# Patient Record
Sex: Female | Born: 1995 | Race: Black or African American | Hispanic: No | Marital: Single | Smoking: Current every day smoker
Health system: Southern US, Community
[De-identification: ages and names within clinical notes are randomized; demographics above are authoritative.]

## PROBLEM LIST (undated history)

## (undated) DIAGNOSIS — J45909 Unspecified asthma, uncomplicated: Secondary | ICD-10-CM

## (undated) DIAGNOSIS — L309 Dermatitis, unspecified: Secondary | ICD-10-CM

## (undated) HISTORY — PX: OTHER SURGICAL HISTORY: SHX169

---

## 2014-05-19 ENCOUNTER — Emergency Department (INDEPENDENT_AMBULATORY_CARE_PROVIDER_SITE_OTHER)
Admission: EM | Admit: 2014-05-19 | Discharge: 2014-05-19 | Disposition: A | Discharge status: Home / Self Care | Payer: Medicaid Other | Source: Home / Self Care

## 2014-05-19 ENCOUNTER — Encounter (HOSPITAL_COMMUNITY): Payer: Self-pay | Admitting: Emergency Medicine

## 2014-05-19 DIAGNOSIS — J4521 Mild intermittent asthma with (acute) exacerbation: Secondary | ICD-10-CM

## 2014-05-19 DIAGNOSIS — J45901 Unspecified asthma with (acute) exacerbation: Secondary | ICD-10-CM

## 2014-05-19 HISTORY — DX: Unspecified asthma, uncomplicated: J45.909

## 2014-05-19 MED ORDER — PREDNISONE 20 MG PO TABS: ORAL_TABLET | ORAL | Status: DC

## 2014-05-19 NOTE — ED Notes (Signed)
Very  Anxious  On  Arrival  Better  After  reassurance

## 2014-05-19 NOTE — ED Provider Notes (Signed)
CSN: [id number]     Arrival date & time 05/19/14  [id number] History   First MD Initiated Contact with Patient 05/19/14 0935     Chief Complaint  Patient presents with  . Asthma   (Consider location/radiation/quality/duration/timing/severity/associated sxs/prior Treatment) HPI Comments: While exercising this AM experienced asthma attack. She used Albuterol HFA prior to exercise and at onset of wheeze. Presented in a panic mode but calmed down a few minutes later. St feel generally well now and breathing normally. Deniies allergy sx's.   Past Medical History  Diagnosis Date  . Asthma    History reviewed. No pertinent past surgical history. History reviewed. No pertinent family history. History  Substance Use Topics  . Smoking status: Never Smoker   . Smokeless tobacco: Not on file  . Alcohol Use: No   OB History   Grav Para Term Preterm Abortions TAB SAB Ect Mult Living                 Review of Systems  Constitutional: Positive for activity change. Negative for fever and fatigue.  HENT: Negative.   Respiratory: Positive for shortness of breath and wheezing. Negative for cough.   Cardiovascular: Negative for chest pain and leg swelling.  Gastrointestinal: Negative.   Skin: Negative.     Allergies  Review of patient's allergies indicates no known allergies.  Home Medications   Prior to Admission medications   Medication Sig Start Date End Date Taking? Authorizing Provider  ALBUTEROL IN Inhale into the lungs.   Yes Historical Provider, MD  predniSONE (DELTASONE) 20 MG tablet Take 3 tabs po on first day, 2 tabs second day, 2 tabs third day, 1 tab fourth day, 1 tab 5th day. Take with food. 05/19/14   Hayden Rasmussenavid Courvoisier Hamblen, NP   BP 122/78  Pulse 84  Temp(Src) 98.6 F (37 C) (Oral)  Resp 18  SpO2 100%  LMP 05/16/2014 Physical Exam  Nursing note and vitals reviewed. Constitutional: She is oriented to person, place, and time. She appears well-developed. No distress.  HENT:  Mouth/Throat:  Oropharynx is clear and moist.  Eyes: Conjunctivae and EOM are normal.  Neck: Normal range of motion. Neck supple.  Cardiovascular: Normal rate, regular rhythm and normal heart sounds.   Pulmonary/Chest: Effort normal and breath sounds normal. No respiratory distress. She has no wheezes. She has no rales.  No wheezing. Exp phase minimally prolonged.   Musculoskeletal: Normal range of motion. She exhibits no edema.  Neurological: She is alert and oriented to person, place, and time. She exhibits normal muscle tone.  Skin: Skin is warm and dry.  Psychiatric: She has a normal mood and affect.    ED Course  Procedures (including critical care time) Labs Review Labs Reviewed - No data to display  Imaging Review No results found.   MDM   1. Asthma exacerbation attacks, mild intermittent     Cont albuterol as directed Add 5 d course Prednisone See PCP, may need daily corticosteroid inhaler.       Hayden Rasmussenavid Conard Alvira, NP 05/19/14 [phone]

## 2014-05-19 NOTE — ED Notes (Signed)
Pt has  Asthma       -  She  Ras  Running  This  Am  When  She  Developed   Some  Shortness  Of  Breath        And    Tightness  In  Chest       She  Used  Her  Inhaler

## 2014-05-19 NOTE — Discharge Instructions (Signed)
Asthma, Acute Bronchospasm °Acute bronchospasm caused by asthma is also referred to as an asthma attack. Bronchospasm means your air passages become narrowed. The narrowing is caused by inflammation and tightening of the muscles in the air tubes (bronchi) in your lungs. This can make it hard to breathe or cause you to wheeze and cough. °CAUSES °Possible triggers are: °· Animal dander from the skin, hair, or feathers of animals. °· Dust mites contained in house dust. °· Cockroaches. °· Pollen from trees or grass. °· Mold. °· Cigarette or tobacco smoke. °· Air pollutants such as dust, household cleaners, hair sprays, aerosol sprays, paint fumes, strong chemicals, or strong odors. °· Cold air or weather changes. Cold air may trigger inflammation. Winds increase molds and pollens in the air. °· Strong emotions such as crying or laughing hard. °· Stress. °· Certain medicines such as aspirin or beta-blockers. °· Sulfites in foods and drinks, such as dried fruits and wine. °· Infections or inflammatory conditions, such as a flu, cold, or inflammation of the nasal membranes (rhinitis). °· Gastroesophageal reflux disease (GERD). GERD is a condition where stomach acid backs up into your esophagus. °· Exercise or strenuous activity. °SIGNS AND SYMPTOMS  °· Wheezing. °· Excessive coughing, particularly at night. °· Chest tightness. °· Shortness of breath. °DIAGNOSIS  °Your health care provider will ask you about your medical history and perform a physical exam. A chest X-ray or blood testing may be performed to look for other causes of your symptoms or other conditions that may have triggered your asthma attack.  °TREATMENT  °Treatment is aimed at reducing inflammation and opening up the airways in your lungs.  Most asthma attacks are treated with inhaled medicines. These include quick relief or rescue medicines (such as bronchodilators) and controller medicines (such as inhaled corticosteroids). These medicines are sometimes  given through an inhaler or a nebulizer. Systemic steroid medicine taken by mouth or given through an IV tube also can be used to reduce the inflammation when an attack is moderate or severe. Antibiotic medicines are only used if a bacterial infection is present.  °HOME CARE INSTRUCTIONS  °· Rest. °· Drink plenty of liquids. This helps the mucus to remain thin and be easily coughed up. Only use caffeine in moderation and do not use alcohol until you have recovered from your illness. °· Do not smoke. Avoid being exposed to secondhand smoke. °· You play a critical role in keeping yourself in good health. Avoid exposure to things that cause you to wheeze or to have breathing problems. °· Keep your medicines up-to-date and available. Carefully follow your health care provider's treatment plan. °· Take your medicine exactly as prescribed. °· When pollen or pollution is bad, keep windows closed and use an air conditioner or go to places with air conditioning. °· Asthma requires careful medical care. See your health care provider for a follow-up as advised. If you are more than [redacted] weeks pregnant and you were prescribed any new medicines, let your obstetrician know about the visit and how you are doing. Follow up with your health care provider as directed. °· After you have recovered from your asthma attack, make an appointment with your outpatient doctor to talk about ways to reduce the likelihood of future attacks. If you do not have a doctor who manages your asthma, make an appointment with a primary care doctor to discuss your asthma. °SEEK IMMEDIATE MEDICAL CARE IF:  °· You are getting worse. °· You have trouble breathing. If severe, call your local   emergency services (911 in the [place]  You develop chest pain or discomfort.  You are vomiting.  You are not able to keep fluids down.  You are coughing up yellow, green, brown, or bloody sputum.  You have a fever and your symptoms suddenly get worse.  You have  trouble swallowing. MAKE SURE YOU:   Understand these instructions.  Will watch your condition.  Will get help right away if you are not doing well or get worse. Document Released: 01/18/2007 Document Revised: 10/08/2013 Document Reviewed: 04/10/2013 [facility]Exit[facility] Patient Information 2015 [employer], [place]. This information is not intended to replace advice given to you by your health care provider. Make sure you discuss any questions you have with your health care provider.  Bronchospasm A bronchospasm is when the tubes that carry air in and out of your lungs (airways) spasm or tighten. During a bronchospasm it is hard to breathe. This is because the airways get smaller. A bronchospasm can be triggered by:  Allergies. These may be to animals, pollen, food, or mold.  Infection. This is a common cause of bronchospasm.  Exercise.  Irritants. These include pollution, cigarette smoke, strong odors, aerosol sprays, and paint fumes.  Weather changes.  Stress.  Being emotional. HOME CARE   Always have a plan for getting help. Know when to call your doctor and local emergency services (911 in the [place] Know where you can get emergency care.  Only take medicines as told by your doctor.  If you were prescribed an inhaler or nebulizer machine, ask your doctor how to use it correctly. Always use a spacer with your inhaler if you were given one.  Stay calm during an attack. Try to relax and breathe more slowly.  Control your home environment:  Change your heating and air conditioning filter at least once a month.  Limit your use of fireplaces and wood stoves.  Do not  smoke. Do not  allow smoking in your home.  Avoid perfumes and fragrances.  Get rid of pests (such as roaches and mice) and their droppings.  Throw away plants if you see mold on them.  Keep your house clean and dust free.  Replace carpet with wood, tile, or vinyl flooring. Carpet can trap dander and dust.  Use  allergy-proof pillows, mattress covers, and box spring covers.  Wash bed sheets and blankets every week in hot water. Dry them in a dryer.  Use blankets that are made of polyester or cotton.  Wash hands frequently. GET HELP IF:  You have muscle aches.  You have chest pain.  The thick spit you spit or cough up (sputum) changes from clear or white to yellow, green, gray, or bloody.  The thick spit you spit or cough up gets thicker.  There are problems that may be related to the medicine you are given such as:  A rash.  Itching.  Swelling.  Trouble breathing. GET HELP RIGHT AWAY IF:  You feel you cannot breathe or catch your breath.  You cannot stop coughing.  Your treatment is not helping you breathe better.  You have very bad chest pain. MAKE SURE YOU:   Understand these instructions.  Will watch your condition.  Will get help right away if you are not doing well or get worse. Document Released: 07/31/2009 Document Revised: 10/08/2013 Document Reviewed: 03/26/2013 [facility]Exit[facility] Patient Information 2015 [employer], [place]. This information is not intended to replace advice given to you by your health care provider. Make sure you discuss any questions  you have with your health care provider.

## 2014-05-21 NOTE — ED Provider Notes (Signed)
Medical screening examination/treatment/procedure(s) were performed by non-physician practitioner and as supervising physician I was immediately available for consultation/collaboration.  Leslee Homeavid Mehtaab Mayeda, M.D.   Reuben Likesavid C Ryver Zadrozny, MD 05/21/14 2200

## 2014-05-30 ENCOUNTER — Emergency Department (HOSPITAL_COMMUNITY)
Admission: EM | Admit: 2014-05-30 | Discharge: 2014-05-30 | Disposition: A | Discharge status: Home / Self Care | Payer: Medicaid Other | Attending: Emergency Medicine | Admitting: Emergency Medicine

## 2014-05-30 ENCOUNTER — Encounter (HOSPITAL_COMMUNITY): Payer: Self-pay | Admitting: Emergency Medicine

## 2014-05-30 DIAGNOSIS — Z79899 Other long term (current) drug therapy: Secondary | ICD-10-CM | POA: Diagnosis not present

## 2014-05-30 DIAGNOSIS — IMO0002 Reserved for concepts with insufficient information to code with codable children: Secondary | ICD-10-CM | POA: Insufficient documentation

## 2014-05-30 DIAGNOSIS — J45901 Unspecified asthma with (acute) exacerbation: Secondary | ICD-10-CM | POA: Insufficient documentation

## 2014-05-30 DIAGNOSIS — J45909 Unspecified asthma, uncomplicated: Secondary | ICD-10-CM | POA: Diagnosis present

## 2014-05-30 DIAGNOSIS — J4521 Mild intermittent asthma with (acute) exacerbation: Secondary | ICD-10-CM

## 2014-05-30 DIAGNOSIS — F411 Generalized anxiety disorder: Secondary | ICD-10-CM | POA: Diagnosis not present

## 2014-05-30 NOTE — ED Notes (Signed)
Per EMS - pt was at band practice when she suddenly felt sob and started having an asthma attack. Pt tried using inhaler but it didn't help. Upon ems arrival pt was only responsive to painful stimuli and very tachypneic. Pt has become more alert en route but has remained non-verbal. Pt eyes open and looking around the room, nodding head yes to questions. ems heard small wheeze and administered 5 mg of albuterol nebulizer treatment, after treatment the wheeze was gone and lung sounds clear. Pt also started her menstrual period this morning and had been c/o cramps prior to this incident. BP 124/92, HR initially 114, now 92, CBG 102.

## 2014-05-30 NOTE — ED Notes (Signed)
Pt now talking. Denies sob/cp/pain to any location. Pt sts she was rushing to go get her instrument for practice then started to feel sob so she laid down on the floor, sts she tried using her inhaler but didn't help at all. Nad, skin warm and dry, resp e/u.

## 2014-05-30 NOTE — ED Provider Notes (Signed)
CSN: [id number]     Arrival date & time 05/30/14  [id number] History   First MD Initiated Contact with Patient 05/30/14 [phone]     Chief Complaint  Patient presents with  . Shortness of Breath   HPI Stacy Munoz is an 18 yo woman with a PMH of asthma (managed on daily albuterol, never hospitalized or intubated). 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 She had taken one puff of her albuterol inhaler prior to practice this morning; when she lay down, her teammate brought her the inhaler and she took 2 more puffs. EMS reports that she was quiet, but responsive to pain upon their arrival; she had some rare wheezing on their exam. EMS delivered 5 mg albuterol on the way to the hospital and conducted an unremarkable EKG. She started her menstrual period this morning and is experiencing some cramping. She denies recent URI, cough, subjective fevers, chills or chest pain. She is starting college this year, but denies any recent stressors or any history of anxiety.  Of note, she was evaluated in the ED 11 days ago with a similar presentation (shortness of breath and tightness in chest after exercise) and was given albuterol and a 5 day course of prednisone, which she completed.   Past Medical History  Diagnosis Date  . Asthma    No past surgical history on file. No family history on file. History  Substance Use Topics  . Smoking status: Never Smoker   . Smokeless tobacco: Not on file  . Alcohol Use: No   OB History   Grav Para Term Preterm Abortions TAB SAB Ect Mult Living                 Review of Systems General: just started college and college band, no recent illness Skin: no rashes HEENT: no headaches, no changes in vision or hearing Cardiac: no chest pain, no palpitations Respiratory: see HPI, shortness of breath, has been taking albuterol puff prior to each band practice (in that she  knows she will be running) GI: no changes in BMs, no abdominal pain Urinary: no changes in urination Msk: no joint pain or swelling Endocrine: no temperature intolerance, no weight changes Psychiatric: patient states that she has no recent stressors and does not think she has ever experienced anxiety   Allergies  Review of patient's allergies indicates no known allergies.  Home Medications   Prior to Admission medications   Medication Sig Start Date End Date Taking? Authorizing Provider  ALBUTEROL IN Inhale into the lungs.    Historical Provider, MD  predniSONE (DELTASONE) 20 MG tablet Take 3 tabs po on first day, 2 tabs second day, 2 tabs third day, 1 tab fourth day, 1 tab 5th day. Take with food. 05/19/14   Hayden Rasmussenavid Mabe, NP   LMP 05/16/2014 Physical Exam Vitals: BP 122/78, P 84, RR 18 SpO2 100% T 98.6 Appearance: anxious-appearing HEENT: AT/[place], PERRL, EOMi Heart: RRR, normal S1S2 Lungs: shallow breathing, no wheezes, CTAB Abdomen: BS+, nontender, no heaptosplenomegaly Musculoskeletal: nontender, no joint swelling Extremities: no edema Neurologic: A&O, quiet in her speech, appears anxious Skin: no rashes  ED Course  Procedures (including critical care time) Labs Review Labs Reviewed - No data to display  Imaging Review No results found.   EKG Interpretation None      MDM   Final diagnoses:  None    Stacy Munoz  is an 18 yo woman with a PMH of mild asthma who is here with a very mild asthma exacerbation after running. In the ED, she has been satting 100% on room air and is not in any sort of respiratory distress; this is s/p several albuterol treatments (4 in total today). There are no signs of recent or current infection, as the patient is afebrile and has no history of cough or subjective fever. She is therefore not in need of another steroid treatment. She does not need a refill of her albuterol. She will be sent out with reassurance.    Dionne Ano,  MD 05/30/14 1042  Dionne Ano, MD 05/30/14 1051

## 2014-05-30 NOTE — Discharge Instructions (Signed)
You likely experienced an asthma exacerbation at practice today; once you were in the hospital, your breathing was perfect, and you were breathing at 100%. However, anxiety can also make you feel short of breath. Here is some information about both asthma and anxiety for your review. You can follow up with your primary doctor for management of either of these conditions. If your shortness of breath worsens or you start feeling chest pain, you can return to the emergency department.  Asthma Attack Prevention Although there is no way to prevent asthma from starting, you can take steps to control the disease and reduce its symptoms. Learn about your asthma and how to control it. Take an active role to control your asthma by working with your health care provider to create and follow an asthma action plan. An asthma action plan guides you in:  Taking your medicines properly.  Avoiding things that set off your asthma or make your asthma worse (asthma triggers).  Tracking your level of asthma control.  Responding to worsening asthma.  Seeking emergency care when needed. To track your asthma, keep records of your symptoms, check your peak flow number using a handheld device that shows how well air moves out of your lungs (peak flow meter), and get regular asthma checkups.  WHAT ARE SOME WAYS TO PREVENT AN ASTHMA ATTACK?  Take medicines as directed by your health care provider.  Keep track of your asthma symptoms and level of control.  With your health care provider, write a detailed plan for taking medicines and managing an asthma attack. Then be sure to follow your action plan. Asthma is an ongoing condition that needs regular monitoring and treatment.  Identify and avoid asthma triggers. Many outdoor allergens and irritants (such as pollen, mold, cold air, and air pollution) can trigger asthma attacks. Find out what your asthma triggers are and take steps to avoid them.  Monitor your breathing.  Learn to recognize warning signs of an attack, such as coughing, wheezing, or shortness of breath. Your lung function may decrease before you notice any signs or symptoms, so regularly measure and record your peak airflow with a home peak flow meter.  Identify and treat attacks early. If you act quickly, you are less likely to have a severe attack. You will also need less medicine to control your symptoms. When your peak flow measurements decrease and alert you to an upcoming attack, take your medicine as instructed and immediately stop any activity that may have triggered the attack. If your symptoms do not improve, get medical help.  Pay attention to increasing quick-relief inhaler use. If you find yourself relying on your quick-relief inhaler, your asthma is not under control. See your health care provider about adjusting your treatment. WHAT CAN MAKE MY SYMPTOMS WORSE? A number of common things can set off or make your asthma symptoms worse and cause temporary increased inflammation of your airways. Keep track of your asthma symptoms for several weeks, detailing all the environmental and emotional factors that are linked with your asthma. When you have an asthma attack, go back to your asthma diary to see which factor, or combination of factors, might have contributed to it. Once you know what these factors are, you can take steps to control many of them. If you have allergies and asthma, it is important to take asthma prevention steps at home. Minimizing contact with the substance to which you are allergic will help prevent an asthma attack. Some triggers and ways to avoid  these triggers are: Animal Dander:  Some people are allergic to the flakes of skin or dried saliva from animals with fur or feathers.   There is no such thing as a hypoallergenic dog or cat breed. All dogs or cats can cause allergies, even if they don't shed.  Keep these pets out of your home.  If you are not able to keep a pet  outdoors, keep the pet out of your bedroom and other sleeping areas at all times, and keep the door closed.  Remove carpets and furniture covered with cloth from your home. If that is not possible, keep the pet away from fabric-covered furniture and carpets. Dust Mites: Many people with asthma are allergic to dust mites. Dust mites are tiny bugs that are found in every home in mattresses, pillows, carpets, fabric-covered furniture, bedcovers, clothes, stuffed toys, and other fabric-covered items.   Cover your mattress in a special dust-proof cover.  Cover your pillow in a special dust-proof cover, or wash the pillow each week in hot water. Water must be hotter than 130 F (54.4 C) to kill dust mites. Cold or warm water used with detergent and bleach can also be effective.  Wash the sheets and blankets on your bed each week in hot water.  Try not to sleep or lie on cloth-covered cushions.  Call ahead when traveling and ask for a smoke-free hotel room. Bring your own bedding and pillows in case the hotel only supplies feather pillows and down comforters, which may contain dust mites and cause asthma symptoms.  Remove carpets from your bedroom and those laid on concrete, if you can.  Keep stuffed toys out of the bed, or wash the toys weekly in hot water or cooler water with detergent and bleach. Cockroaches: Many people with asthma are allergic to the droppings and remains of cockroaches.   Keep food and garbage in closed containers. Never leave food out.  Use poison baits, traps, powders, gels, or paste (for example, boric acid).  If a spray is used to kill cockroaches, stay out of the room until the odor goes away. Indoor Mold:  Fix leaky faucets, pipes, or other sources of water that have mold around them.  Clean floors and moldy surfaces with a fungicide or diluted bleach.  Avoid using humidifiers, vaporizers, or swamp coolers. These can spread molds through the air. Pollen and  Outdoor Mold:  When pollen or mold spore counts are high, try to keep your windows closed.  Stay indoors with windows closed from late morning to afternoon. Pollen and some mold spore counts are highest at that time.  Ask your health care provider whether you need to take anti-inflammatory medicine or increase your dose of the medicine before your allergy season starts. Other Irritants to Avoid:  Tobacco smoke is an irritant. If you smoke, ask your health care provider how you can quit. Ask family members to quit smoking, too. Do not allow smoking in your home or car.  If possible, do not use a wood-burning stove, kerosene heater, or fireplace. Minimize exposure to all sources of smoke, including incense, candles, fires, and fireworks.  Try to stay away from strong odors and sprays, such as perfume, talcum powder, hair spray, and paints.  Decrease humidity in your home and use an indoor air cleaning device. Reduce indoor humidity to below 60%. Dehumidifiers or central air conditioners can do this.  Decrease house dust exposure by changing furnace and air cooler filters frequently.  Try to have  someone else vacuum for you once or twice a week. Stay out of rooms while they are being vacuumed and for a short while afterward.  If you vacuum, use a dust mask from a hardware store, a double-layered or microfilter vacuum cleaner bag, or a vacuum cleaner with a HEPA filter.  Sulfites in foods and beverages can be irritants. Do not drink beer or wine or eat dried fruit, processed potatoes, or shrimp if they cause asthma symptoms.  Cold air can trigger an asthma attack. Cover your nose and mouth with a scarf on cold or windy days.  Several health conditions can make asthma more difficult to manage, including a runny nose, sinus infections, reflux disease, psychological stress, and sleep apnea. Work with your health care provider to manage these conditions.  Avoid close contact with people who have  a respiratory infection such as a cold or the flu, since your asthma symptoms may get worse if you catch the infection. Wash your hands thoroughly after touching items that may have been handled by people with a respiratory infection.  Get a flu shot every year to protect against the flu virus, which often makes asthma worse for days or weeks. Also get a pneumonia shot if you have not previously had one. Unlike the flu shot, the pneumonia shot does not need to be given yearly. Medicines:  Talk to your health care provider about whether it is safe for you to take aspirin or non-steroidal anti-inflammatory medicines (NSAIDs). In a small number of people with asthma, aspirin and NSAIDs can cause asthma attacks. These medicines must be avoided by people who have known aspirin-sensitive asthma. It is important that people with aspirin-sensitive asthma read labels of all over-the-counter medicines used to treat pain, colds, coughs, and fever.  Beta-blockers and ACE inhibitors are other medicines you should discuss with your health care provider. HOW CAN I FIND OUT WHAT I AM ALLERGIC TO? Ask your asthma health care provider about allergy skin testing or blood testing (the RAST test) to identify the allergens to which you are sensitive. If you are found to have allergies, the most important thing to do is to try to avoid exposure to any allergens that you are sensitive to as much as possible. Other treatments for allergies, such as medicines and allergy shots (immunotherapy) are available.  CAN I EXERCISE? Follow your health care provider's advice regarding asthma treatment before exercising. It is important to maintain a regular exercise program, but vigorous exercise or exercise in cold, humid, or dry environments can cause asthma attacks, especially for those people who have exercise-induced asthma. Document Released: 09/21/2009 Document Revised: 10/08/2013 Document Reviewed: 04/10/2013 [facility] Patient  Information 2015 [place], [place]. This information is not intended to replace advice given to you by your health care provider. Make sure you discuss any questions you have with your health care provider.    Panic Attacks Panic attacks are sudden, short-livedsurges of severe anxiety, fear, or discomfort. They may occur for no reason when you are relaxed, when you are anxious, or when you are sleeping. Panic attacks may occur for a number of reasons:   Healthy people occasionally have panic attacks in extreme, life-threatening situations, such as war or natural disasters. Normal anxiety is a protective mechanism of the body that helps [place] react to danger (fight or flight response).  Panic attacks are often seen with anxiety disorders, such as panic disorder, social anxiety disorder, generalized anxiety disorder, and phobias. Anxiety disorders cause excessive or uncontrollable anxiety.  They may interfere with your relationships or other life activities.  Panic attacks are sometimes seen with other mental illnesses, such as depression and posttraumatic stress disorder.  Certain medical conditions, prescription medicines, and drugs of abuse can cause panic attacks. SYMPTOMS  Panic attacks start suddenly, peak within 20 minutes, and are accompanied by four or more of the following symptoms:  Pounding heart or fast heart rate (palpitations).  Sweating.  Trembling or shaking.  Shortness of breath or feeling smothered.  Feeling choked.  Chest pain or discomfort.  Nausea or strange feeling in your stomach.  Dizziness, light-headedness, or feeling like you will faint.  Chills or hot flushes.  Numbness or tingling in your lips or hands and feet.  Feeling that things are not real or feeling that you are not yourself.  Fear of losing control or going crazy.  Fear of dying. Some of these symptoms can mimic serious medical conditions. For example, you may think you are having a heart  attack. Although panic attacks can be very scary, they are not life threatening. DIAGNOSIS  Panic attacks are diagnosed through an assessment by your health care provider. Your health care provider will ask questions about your symptoms, such as where and when they occurred. Your health care provider will also ask about your medical history and use of alcohol and drugs, including prescription medicines. Your health care provider may order blood tests or other studies to rule out a serious medical condition. Your health care provider may refer you to a mental health professional for further evaluation. TREATMENT   Most healthy people who have one or two panic attacks in an extreme, life-threatening situation will not require treatment.  The treatment for panic attacks associated with anxiety disorders or other mental illness typically involves counseling with a mental health professional, medicine, or a combination of both. Your health care provider will help determine what treatment is best for you.  Panic attacks due to physical illness usually go away with treatment of the illness. If prescription medicine is causing panic attacks, talk with your health care provider about stopping the medicine, decreasing the dose, or substituting another medicine.  Panic attacks due to alcohol or drug abuse go away with abstinence. Some adults need professional help in order to stop drinking or using drugs. HOME CARE INSTRUCTIONS   Take all medicines as directed by your health care provider.   Schedule and attend follow-up visits as directed by your health care provider. It is important to keep all your appointments. SEEK MEDICAL CARE IF:  You are not able to take your medicines as prescribed.  Your symptoms do not improve or get worse. SEEK IMMEDIATE MEDICAL CARE IF:   You experience panic attack symptoms that are different than your usual symptoms.  You have serious thoughts about hurting yourself or  others.  You are taking medicine for panic attacks and have a serious side effect. MAKE SURE YOU:  Understand these instructions.  Will watch your condition.  Will get help right away if you are not doing well or get worse. Document Released: 10/03/2005 Document Revised: 10/08/2013 Document Reviewed: 05/17/2013 [facility] Patient Information 2015 [place], [place]. This information is not intended to replace advice given to you by your health care provider. Make sure you discuss any questions you have with your health care provider.

## 2014-06-02 NOTE — ED Provider Notes (Signed)
I saw and evaluated the patient, reviewed the resident's note and I agree with the findings and plan.   EKG Interpretation   Date/Time:  Friday May 30 2014 09:56:21 EDT Ventricular Rate:  99 PR Interval:  160 QRS Duration: 82 QT Interval:  346 QTC Calculation: 444 R Axis:   72 Text Interpretation:  Normal sinus rhythm Normal ECG No old tracing to  compare Reconfirmed by Tanicia Wolaver  MD, Quantrell Splitt ([id number]) on 05/30/2014 10:05:06 AM       Patient apparently had wheezing per EMS. At this time she is able to speak normally, talked and text on her cell phone, has normal oxygen saturations. She has no wheezing or distress at this time. She is stable for discharge. Do not feel she needs further steroids.  Audree CamelScott T Hillis Mcphatter, MD 06/02/14 1340

## 2017-07-22 ENCOUNTER — Encounter (HOSPITAL_COMMUNITY): Payer: Self-pay

## 2017-07-22 ENCOUNTER — Ambulatory Visit (HOSPITAL_COMMUNITY)
Admission: EM | Admit: 2017-07-22 | Discharge: 2017-07-22 | Disposition: A | Discharge status: Home / Self Care | Payer: BLUE CROSS/BLUE SHIELD | Attending: Family Medicine | Admitting: Family Medicine

## 2017-07-22 DIAGNOSIS — T63441A Toxic effect of venom of bees, accidental (unintentional), initial encounter: Secondary | ICD-10-CM | POA: Diagnosis not present

## 2017-07-22 NOTE — ED Triage Notes (Signed)
Patient presents to [facility] for insect sting on back of rt thigh.

## 2017-07-22 NOTE — ED Notes (Signed)
Pt discharged by provider.

## 2017-07-24 NOTE — ED Provider Notes (Signed)
  [facility]   [id number] 07/22/17 Arrival Time: 1543  ASSESSMENT & PLAN:  1. Bee sting, accidental or unintentional, initial encounter    Observation. No sign of infection. May f/u as needed. Reviewed expectations re: course of current medical issues. Questions answered. Outlined signs and symptoms indicating need for more acute intervention. Patient verbalized understanding. After Visit Summary given.   SUBJECTIVE:  Stacy Munoz is a 21 y.o. female who presents with complaint of being stung by a bee or wasp today. Back of R thigh. Some discomfort. No swelling. No respiratory symptoms or n/v. Ambulatory without problem. No specific aggravating or alleviating factors reported. No self treatment.  ROS: As per HPI.   OBJECTIVE:  Vitals:   07/22/17 1634  BP: 106/72  Pulse: 66  Resp: 15  Temp: 97.8 F (36.6 C)  TempSrc: Oral  SpO2: 97%    General appearance: alert; no distress Extremities: no cyanosis or edema; symmetrical with no gross deformities Skin: she points to area of sting but I have a hard time seeing anything; no lesions or raised areas; no erythema Neurologic: normal gait; normal symmetric reflexes Psychological: alert and cooperative; normal mood and affect   No Known Allergies  Past Medical History:  Diagnosis Date  . Asthma    Social History   Social History  . Marital status: Single    Spouse name: N/A  . Number of children: N/A  . Years of education: N/A   Occupational History  . Not on file.   Social History Main Topics  . Smoking status: Never Smoker  . Smokeless tobacco: Never Used  . Alcohol use No  . Drug use: No  . Sexual activity: No   Other Topics Concern  . Not on file   Social History Narrative  . No narrative on file      Mardella Layman, MD 07/24/17 [phone]

## 2020-07-06 ENCOUNTER — Emergency Department (HOSPITAL_COMMUNITY): Payer: 59

## 2020-07-06 ENCOUNTER — Other Ambulatory Visit: Payer: Self-pay

## 2020-07-06 ENCOUNTER — Emergency Department (HOSPITAL_COMMUNITY)
Admission: EM | Admit: 2020-07-06 | Discharge: 2020-07-06 | Disposition: A | Discharge status: Home / Self Care | Payer: 59 | Attending: Emergency Medicine | Admitting: Emergency Medicine

## 2020-07-06 ENCOUNTER — Encounter (HOSPITAL_COMMUNITY): Payer: Self-pay

## 2020-07-06 DIAGNOSIS — F17210 Nicotine dependence, cigarettes, uncomplicated: Secondary | ICD-10-CM | POA: Insufficient documentation

## 2020-07-06 DIAGNOSIS — R079 Chest pain, unspecified: Secondary | ICD-10-CM | POA: Diagnosis present

## 2020-07-06 DIAGNOSIS — J45909 Unspecified asthma, uncomplicated: Secondary | ICD-10-CM | POA: Insufficient documentation

## 2020-07-06 HISTORY — DX: Dermatitis, unspecified: L30.9

## 2020-07-06 LAB — CBC
HCT: 43.9 % (ref 36.0–46.0)
Hemoglobin: 14.1 g/dL (ref 12.0–15.0)
MCH: 31 pg (ref 26.0–34.0)
MCHC: 32.1 g/dL (ref 30.0–36.0)
MCV: 96.5 fL (ref 80.0–100.0)
Platelets: 241 10*3/uL (ref 150–400)
RBC: 4.55 MIL/uL (ref 3.87–5.11)
RDW: 12.1 % (ref 11.5–15.5)
WBC: 4.9 10*3/uL (ref 4.0–10.5)
nRBC: 0 % (ref 0.0–0.2)

## 2020-07-06 LAB — BASIC METABOLIC PANEL
Anion gap: 9 (ref 5–15)
BUN: 9 mg/dL (ref 6–20)
CO2: 24 mmol/L (ref 22–32)
Calcium: 9.2 mg/dL (ref 8.9–10.3)
Chloride: 105 mmol/L (ref 98–111)
Creatinine, Ser: 1.02 mg/dL — ABNORMAL HIGH (ref 0.44–1.00)
GFR calc Af Amer: >60 mL/min (ref >60)
GFR calc non Af Amer: >60 mL/min (ref >60)
Glucose, Bld: 89 mg/dL (ref 70–99)
Potassium: 3.4 mmol/L — ABNORMAL LOW (ref 3.5–5.1)
Sodium: 138 mmol/L (ref 135–145)

## 2020-07-06 LAB — I-STAT BETA HCG BLOOD, ED (NOT ORDERABLE): I-stat hCG, quantitative: <5 m[IU]/mL (ref <5)

## 2020-07-06 LAB — TROPONIN I (HIGH SENSITIVITY): Troponin I (High Sensitivity): 2 ng/L (ref <18)

## 2020-07-06 NOTE — Discharge Instructions (Signed)
Recommend following up with your primary doctor regarding the symptoms experiencing today.  Recommend taking anti-inflammatory such as naproxen or Motrin for pain control.  If you develop worsening chest pain, difficulty in breathing, passing out, or other new concerning symptom, return to ER for reassessment.

## 2020-07-06 NOTE — ED Provider Notes (Signed)
[facility]-EMERGENCY DEPT Provider Note   CSN: [id number] Arrival date & time: 07/06/20  1552     History Chief Complaint  Patient presents with  . Chest Pain    Stacy Munoz is a 24 y.o. female.  Presents to ER with concern for chest pain.  Patient reports that couple months ago she is concerned she may have injured her sternum and was having pain with certain movements back then.  Resolved and have gone away.  However yesterday she noticed the pain came back.  Pain is worse with certain movements, stretching, taking deep breaths.  No difficulty in breathing.  Not associated with exertion.  Feels that she has tenderness over the center of her chest.  No fever.  Only med is depo shot.  Denies prior history of DVT/PE, no family history CAD.  Denies smoking.  HPI     Past Medical History:  Diagnosis Date  . Asthma   . Eczema     There are no problems to display for this patient.   Past Surgical History:  Procedure Laterality Date  . spinal tap       OB History   No obstetric history on file.     Family History  Problem Relation Age of Onset  . Deafness Mother   . Bipolar disorder Mother     Social History   Tobacco Use  . Smoking status: Current Every Day Smoker    Packs/day: 0.10    Types: Cigarettes  . Smokeless tobacco: Never Used  Vaping Use  . Vaping Use: Never used  Substance Use Topics  . Alcohol use: No  . Drug use: No    Home Medications Prior to Admission medications   Medication Sig Start Date End Date Taking? Authorizing Provider  ibuprofen (ADVIL) 200 MG tablet Take 200 mg by mouth every 6 (six) hours as needed for headache or moderate pain.   Yes [provider]  medroxyPROGESTERone (DEPO-PROVERA) 150 MG/ML injection Inject 150 mg into the muscle every 3 (three) months.  03/24/20  Yes [provider]  S-Adenosylmethionine-B6-B12-FA (MOOD PLUS STRESS RELIEF PO) Take 1 tablet by mouth daily.   Yes  [provider]    Allergies    Patient has no known allergies.  Review of Systems   Review of Systems  Constitutional: Negative for chills and fever.  HENT: Negative for ear pain and sore throat.   Eyes: Negative for pain and visual disturbance.  Respiratory: Negative for cough and shortness of breath.   Cardiovascular: Positive for chest pain. Negative for palpitations.  Gastrointestinal: Negative for abdominal pain and vomiting.  Genitourinary: Negative for dysuria and hematuria.  Musculoskeletal: Negative for arthralgias and back pain.  Skin: Negative for color change and rash.  Neurological: Negative for seizures and syncope.  All other systems reviewed and are negative.   Physical Exam Updated Vital Signs BP 117/87   Pulse 88   Temp 98.3 F (36.8 C) (Oral)   Resp 13   Ht 5\' 7"  (1.702 m)   Wt 81.6 kg   SpO2 100%   BMI 28.19 kg/m   Physical Exam Vitals and nursing note reviewed.  Constitutional:      General: She is not in acute distress.    Appearance: She is well-developed.  HENT:     Head: Normocephalic and atraumatic.  Eyes:     Conjunctiva/sclera: Conjunctivae normal.  Cardiovascular:     Rate and Rhythm: Normal rate and regular rhythm.  Heart sounds: No murmur heard.   Pulmonary:     Effort: Pulmonary effort is normal. No respiratory distress.     Breath sounds: Normal breath sounds.  Chest:     Comments: TTP over center chest wall Abdominal:     Palpations: Abdomen is soft.     Tenderness: There is no abdominal tenderness.  Musculoskeletal:     Cervical back: Neck supple.     Right lower leg: No tenderness. No edema.     Left lower leg: No tenderness. No edema.  Skin:    General: Skin is warm and dry.  Neurological:     General: No focal deficit present.     Mental Status: She is alert and oriented to person, place, and time.     ED Results / Procedures / Treatments   Labs (all labs ordered are listed, but only abnormal  results are displayed) Labs Reviewed  BASIC METABOLIC PANEL - Abnormal; Notable for the following components:      Result Value   Potassium 3.4 (*)    Creatinine, Ser 1.02 (*)    All other components within normal limits  CBC  I-STAT BETA HCG BLOOD, ED ([facility], [facility], [facility] ONLY)  I-STAT BETA HCG BLOOD, ED (NOT ORDERABLE)  TROPONIN I (HIGH SENSITIVITY)    EKG EKG Interpretation  Date/Time:  Monday July 06 2020 16:08:29 EDT Ventricular Rate:  96 PR Interval:    QRS Duration: 74 QT Interval:  349 QTC Calculation: 441 R Axis:   66 Text Interpretation: Sinus rhythm Ventricular premature complex Borderline T wave abnormalities No significant change since last tracing Confirmed by Susy Frizzle [id number]) on 07/06/2020 4:32:29 PM   Radiology DG Chest 2 View  Result Date: 07/06/2020 CLINICAL DATA:  Patient reports that she has been having intermittent mid sternal chest pain that worsens when she takes a deep breath. H/o asthma. Smoker. EXAM: CHEST - 2 VIEW COMPARISON:  None. FINDINGS: The cardiomediastinal contours are within normal limits. The lungs are clear. No pneumothorax or pleural effusion. No acute finding in the visualized skeleton. IMPRESSION: No acute cardiopulmonary process. Electronically Signed   By: Emmaline Kluver M.D.   On: 07/06/2020 16:47    Procedures Procedures (including critical care time)  Medications Ordered in ED Medications - No data to display  ED Course  I have reviewed the triage vital signs and the nursing notes.  Pertinent labs & imaging results that were available during my care of the patient were reviewed by me and considered in my medical decision making (see chart for details).    MDM Rules/Calculators/A&P                         24 year old presented to ER with concern for chest pain.  On exam, patient is noted to be remarkably well-appearing, stable vital signs.  EKG without obvious ischemic changes.  High-sensitivity troponin is 2.  Based on  these findings and symptomatology, very low suspicion for ACS.  She has no hypoxia, tachypnea, tachycardia, very low suspicion for PE.  Given the positional nature, tenderness on exam, very high suspicion for MSK strain.  Recommended NSAIDs and follow-up with primary doctor.  Reviewed return precautions and discharged home.    After the discussed management above, the patient was determined to be safe for discharge.  The patient was in agreement with this plan and all questions regarding their care were answered.  ED return precautions were discussed and the patient will return  to the ED with any significant worsening of condition.    Final Clinical Impression(s) / ED Diagnoses Final diagnoses:  Chest pain, unspecified type    Rx / DC Orders ED Discharge Orders    None       Milagros Loll, MD 07/06/20 1728

## 2020-07-06 NOTE — ED Triage Notes (Signed)
Patient reports that she has been having intermittent mid chest pain that worsens when she takes a deep breath. Patient states pain lessens after taking Ibuprofen. Patient states she is also having breast pain as well.

## 2020-12-28 DIAGNOSIS — Z20822 Contact with and (suspected) exposure to covid-19: Secondary | ICD-10-CM | POA: Diagnosis not present

## 2021-03-17 ENCOUNTER — Encounter: Payer: Self-pay | Admitting: Critical Care Medicine

## 2021-03-17 ENCOUNTER — Other Ambulatory Visit: Payer: Self-pay | Admitting: Critical Care Medicine

## 2021-03-17 ENCOUNTER — Other Ambulatory Visit (HOSPITAL_COMMUNITY): Payer: Self-pay

## 2021-03-17 MED ORDER — SERTRALINE HCL 100 MG PO TABS
100.0000 mg | ORAL_TABLET | Freq: Every day | ORAL | 3 refills | Status: DC
  Filled 2021-03-17 – 2021-03-29 (×2): qty 30, 30d supply, fill #0

## 2021-03-17 MED ORDER — CLOBETASOL PROPIONATE 0.05 % EX CREA
1.0000 "application " | TOPICAL_CREAM | Freq: Two times a day (BID) | CUTANEOUS | 0 refills | Status: DC
  Filled 2021-03-17 – 2021-03-29 (×2): qty 30, 30d supply, fill #0
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
## 2021-03-18 ENCOUNTER — Other Ambulatory Visit (HOSPITAL_COMMUNITY): Payer: Self-pay

## 2021-03-26 ENCOUNTER — Other Ambulatory Visit (HOSPITAL_COMMUNITY): Payer: Self-pay

## 2021-03-29 ENCOUNTER — Other Ambulatory Visit (HOSPITAL_COMMUNITY): Payer: Self-pay

## 2021-03-29 ENCOUNTER — Telehealth: Payer: Self-pay | Admitting: Critical Care Medicine

## 2021-03-29 NOTE — Telephone Encounter (Signed)
Patient requested for the two medications on her profile to be transferred to pharmacy nearer to her address. She has not filled the requested medications at [facility]. TC to [facility] [address] [place]-had prescriptions transferred including Zoloft 100 mg tabs one tab to be taken daily #90 with one refill and Clobetasol Cream )0.05% to be applied twice daily 30 grams no refills. These were transferred as written on 03/17/21. Informed the patient that the [employer] may not be available to pay for these outside of [facility]. Patient stated she is better financially suited to pay for the medications at this time.

## 2021-03-30 ENCOUNTER — Other Ambulatory Visit (HOSPITAL_COMMUNITY): Payer: Self-pay

## 2021-04-27 ENCOUNTER — Other Ambulatory Visit: Payer: Self-pay

## 2021-04-27 ENCOUNTER — Ambulatory Visit: Payer: Self-pay | Attending: Critical Care Medicine | Admitting: Critical Care Medicine

## 2021-04-27 DIAGNOSIS — Z91199 Patient's noncompliance with other medical treatment and regimen due to unspecified reason: Secondary | ICD-10-CM

## 2021-04-27 DIAGNOSIS — Z5329 Procedure and treatment not carried out because of patient's decision for other reasons: Secondary | ICD-10-CM

## 2021-04-27 NOTE — Progress Notes (Signed)
Pt was a no show

## 2021-05-01 IMAGING — CR DG CHEST 2V
2 series · 2 of 2 positions shown · non-contrast
Comparison: None.

CLINICAL DATA: Patient reports that she has been having
intermittent mid sternal chest pain that worsens when she takes a
deep breath. H/o asthma. Smoker.

EXAM:
CHEST - 2 VIEW

[w chest pa]
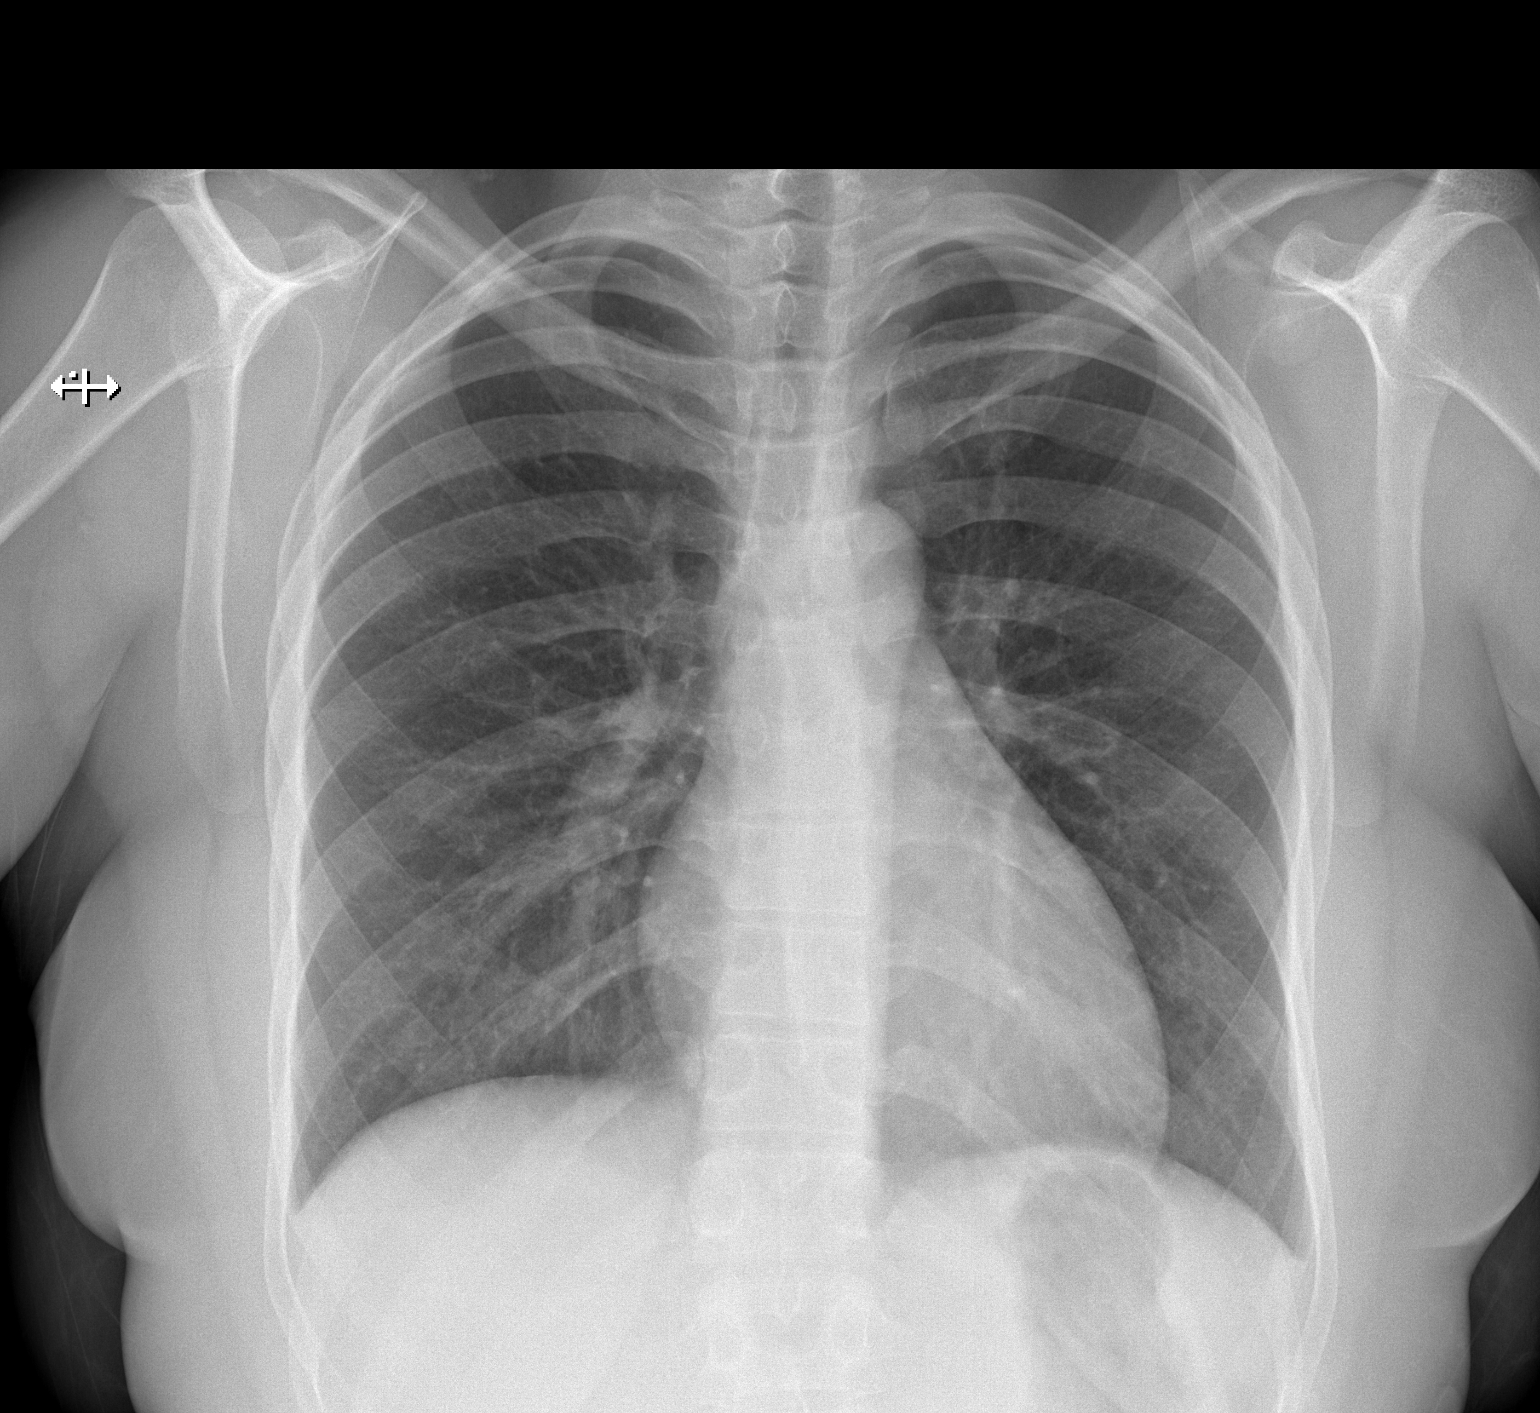

[w chest lat]
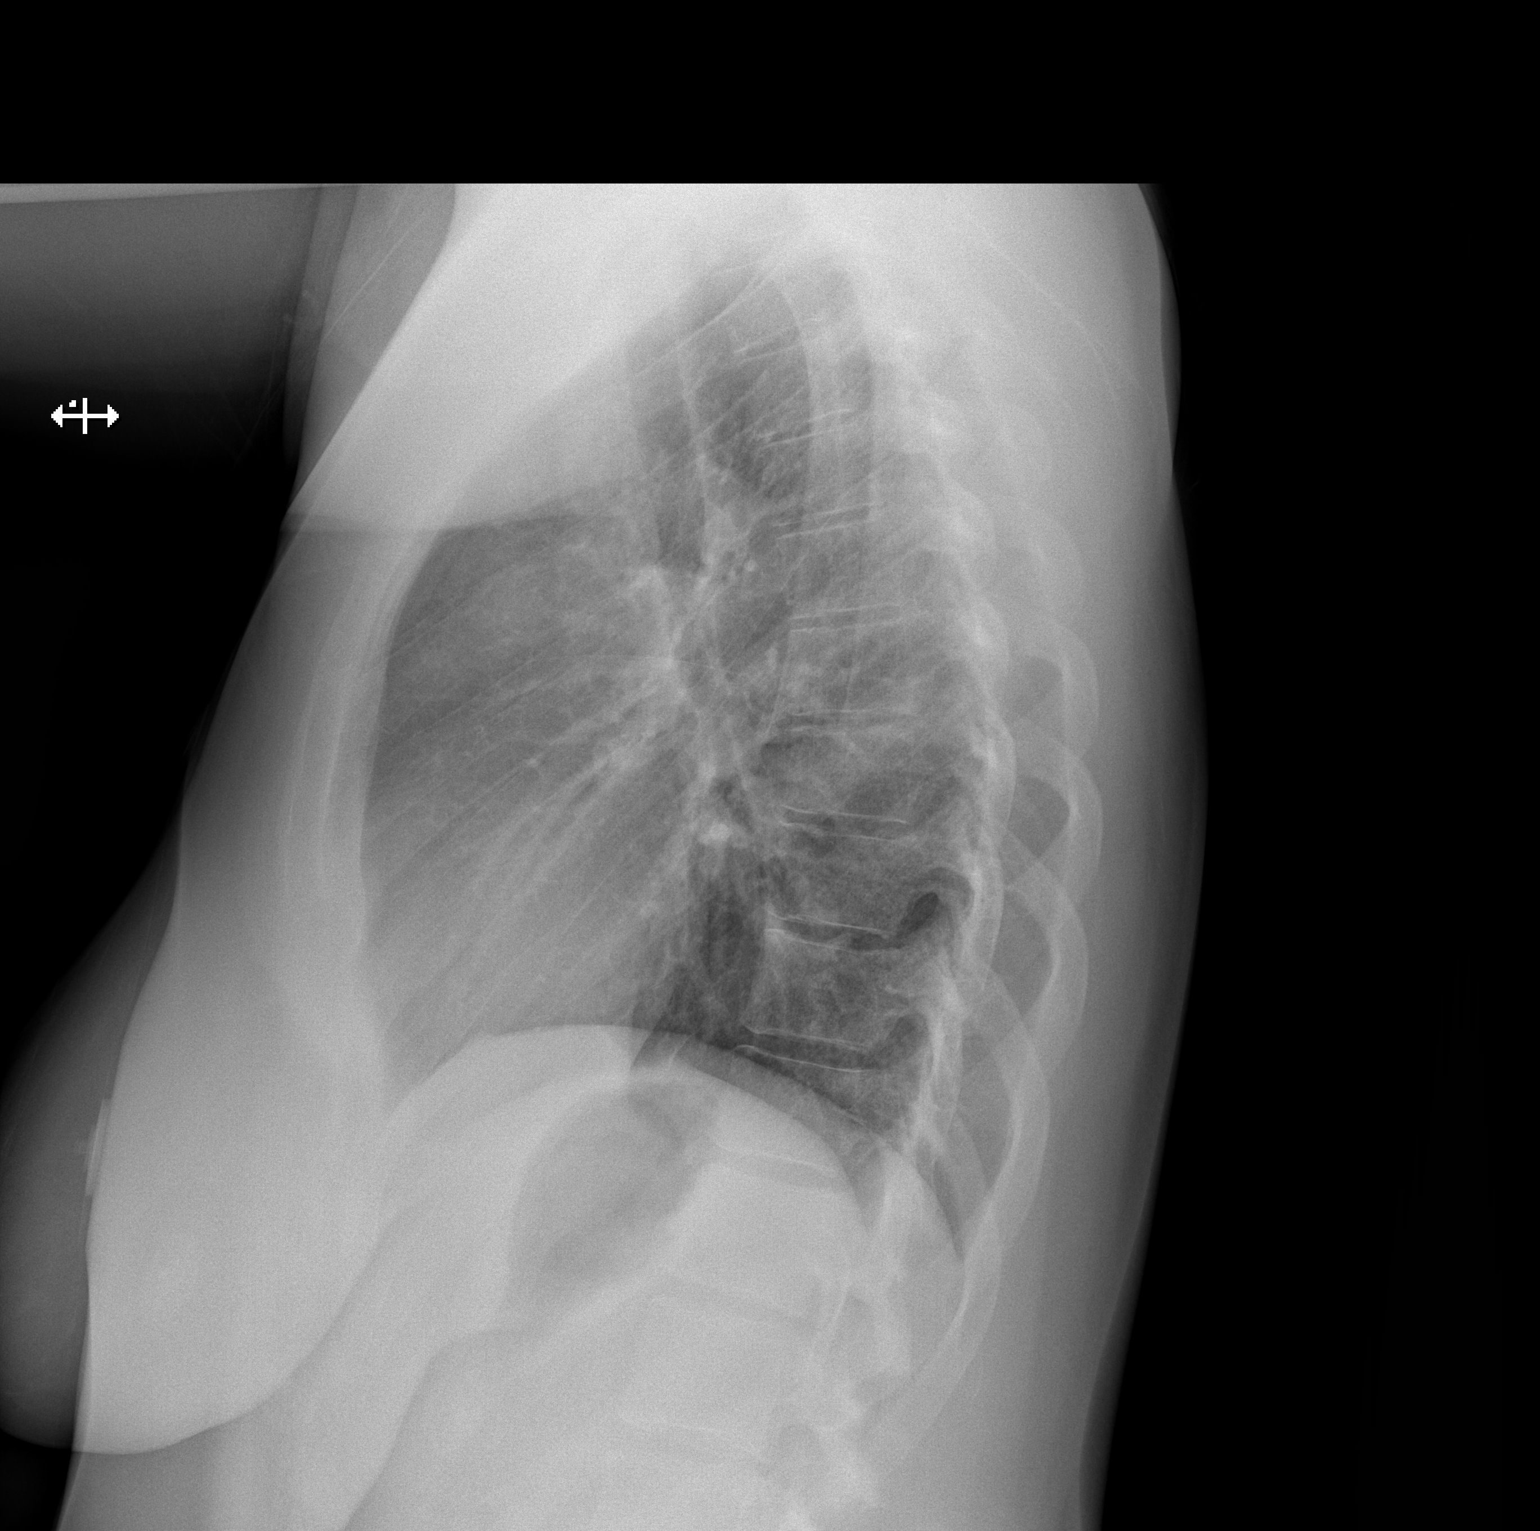

[2 of 2 positions shown; findings below may reference images not displayed]

FINDINGS: The cardiomediastinal contours are within normal limits. The lungs
are clear. No pneumothorax or pleural effusion. No acute finding in
the visualized skeleton.
IMPRESSION: No acute cardiopulmonary process.

## 2022-10-20 ENCOUNTER — Ambulatory Visit (HOSPITAL_COMMUNITY)
Admission: EM | Admit: 2022-10-20 | Discharge: 2022-10-20 | Disposition: A | Discharge status: Home / Self Care | Payer: Medicaid Other | Attending: Behavioral Health | Admitting: Behavioral Health

## 2022-10-20 DIAGNOSIS — F331 Major depressive disorder, recurrent, moderate: Secondary | ICD-10-CM | POA: Insufficient documentation

## 2022-10-20 DIAGNOSIS — F410 Panic disorder [episodic paroxysmal anxiety] without agoraphobia: Secondary | ICD-10-CM | POA: Insufficient documentation

## 2022-10-20 MED ORDER — SERTRALINE HCL 50 MG PO TABS: 50.0000 mg | ORAL_TABLET | Freq: Every day | ORAL | 0 refills | Status: DC

## 2022-10-20 MED ORDER — HYDROXYZINE PAMOATE 25 MG PO CAPS: 25.0000 mg | ORAL_CAPSULE | Freq: Three times a day (TID) | ORAL | 0 refills | Status: AC | PRN

## 2022-10-20 NOTE — ED Triage Notes (Signed)
Pt presents to [facility] voluntarily due to worsening anxiety symptoms. Pt reports being diagnosed with PTSD, anxiety and depression. Pt states she has not taken medication for her symptoms in about 1 year. Pt reports anxiety attacks 2x daily, crying spells. Pt denies SI/HI and AVH.
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
## 2022-10-20 NOTE — Discharge Instructions (Addendum)
Discharge recommendations:   Medications: Patient is to take medications as prescribed. The patient or patient's guardian is to contact a medical professional and/or outpatient provider to address any new side effects that develop. The patient or the patient's guardian should update outpatient providers of any new medications and/or medication changes.   Outpatient Follow up: Please review list of outpatient resources for psychiatry and counseling. Please follow up with your primary care provider for all medical related needs.   Therapy: We recommend that patient participate in individual therapy to address mental health concerns.  Safety:   The following safety precautions should be taken:   No sharp objects. This includes scissors, razors, scrapers, and putty knives.   Chemicals should be removed and locked up.   Medications should be removed and locked up.   Weapons should be removed and locked up. This includes firearms, knives and instruments that can be used to cause injury.   The patient should abstain from use of illicit substances/drugs and abuse of any medications.  If symptoms worsen or do not continue to improve or if the patient becomes actively suicidal or homicidal then it is recommended that the patient return to the closest hospital emergency department, the [facility], or call 911 for further evaluation and treatment. National Suicide Prevention Lifeline 1-800-SUICIDE or 1-800-273-8255.  About 988 988 offers 24/7 access to trained crisis counselors who can help people experiencing mental health-related distress. People can call or text 988 or chat 988lifeline.org for themselves or if they are worried about a loved one who may need crisis support.      

## 2022-10-21 ENCOUNTER — Ambulatory Visit (INDEPENDENT_AMBULATORY_CARE_PROVIDER_SITE_OTHER): Payer: Medicaid Other | Admitting: Psychiatry

## 2022-10-21 VITALS — BP 110/67 | HR 81

## 2022-10-21 DIAGNOSIS — F4323 Adjustment disorder with mixed anxiety and depressed mood: Secondary | ICD-10-CM
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
## 2022-10-25 ENCOUNTER — Encounter (HOSPITAL_COMMUNITY): Payer: Self-pay | Admitting: Psychiatry

## 2022-11-15 ENCOUNTER — Encounter (HOSPITAL_COMMUNITY): Payer: Self-pay

## 2022-11-15 ENCOUNTER — Ambulatory Visit (INDEPENDENT_AMBULATORY_CARE_PROVIDER_SITE_OTHER): Payer: Medicaid Other | Admitting: Clinical

## 2022-11-15 DIAGNOSIS — F331 Major depressive disorder, recurrent, moderate: Secondary | ICD-10-CM
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
## 2022-11-22 ENCOUNTER — Encounter (HOSPITAL_COMMUNITY): Payer: Medicaid Other | Admitting: Psychiatry

## 2022-12-02 ENCOUNTER — Telehealth: Payer: Self-pay

## 2022-12-02 ENCOUNTER — Ambulatory Visit (INDEPENDENT_AMBULATORY_CARE_PROVIDER_SITE_OTHER): Payer: Medicaid Other | Admitting: Psychiatry

## 2022-12-02 VITALS — BP 113/72 | HR 71

## 2022-12-02 DIAGNOSIS — F4323 Adjustment disorder with mixed anxiety and depressed mood: Secondary | ICD-10-CM | POA: Diagnosis not present

## 2022-12-02 MED ORDER — SERTRALINE HCL 50 MG PO TABS: 50.0000 mg | ORAL_TABLET | Freq: Every day | ORAL | 2 refills | Status: DC

## 2022-12-02 NOTE — Telephone Encounter (Signed)
Mychart msg sent. AS, CMA

## 2022-12-02 NOTE — Progress Notes (Cosign Needed Addendum)
BH MD/PA/NP OP Progress Note  12/02/2022 7:57 PM Stacy Munoz  MRN:  [id number]  Chief Complaint:  Chief Complaint  Patient presents with   Follow-up   HPI:  Patient is a 27 y.o.  female with reported past psychiatric history of depression presented to [facility] outpatient clinic for medication management follow-up.  Pt reports that her mood is " good". She reports improvement in symptoms of depression and anxiety.  She has been sleeping and eating well.  Currently, she denies any suicidal ideations, homicidal ideations, auditory and visual hallucinations.  She denies paranoia.  She denies any medication side effects and has been tolerating it well.  She has started therapy at [facility] with [facility]. She reports that she feels more motivated and confident.  She has dropped out of her work study and school due to feeling overwhelmed and workload.  She does not regret that and feels that it was not very helpful for her career.  She feels happy and enjoys her work.  She already has a college degree in music.  She is planning to do something else which will help her in her career.  She is currently employed at [employer]  She started drinking and has cut down on marijuana use.  She does not smoke cigarettes.  Patient denies any need for change in medication and medication dosages and wants to continue same meds.  She denies any other concerns. Patient is alert and oriented x 4,  calm, cooperative, and fully engaged in conversation during the encounter.  Her thought process is linear with coherent speech . She does not appear to be responding to internal/external stimuli .     Visit Diagnosis:    ICD-10-CM   1. Adjustment disorder with mixed anxiety and depressed mood  F43.23 sertraline (ZOLOFT) 50 MG tablet      Past Psychiatric History: Previous Psych Diagnoses: Reported history of depression Prior inpatient treatment: Denies Psychotherapy hx: Got some therapy in 2021 Previous suicidal attempts:  In 2021 was thinking of cutting her neck and wrist with a razor blade.  Self-injurious behaviors by cutting in 2021 Previous medication trials: Zoloft (max 100 mg daily).  Stopped taking as she was feeling better Current therapist: None  Past Medical History:  Past Medical History:  Diagnosis Date   Asthma    Eczema     Past Surgical History:  Procedure Laterality Date   spinal tap      Family Psychiatric History: Psych: Mom-bipolar SA/HA: Denies    Family History:  Family History  Problem Relation Age of Onset   Deafness Mother    Bipolar disorder Mother     Social History:  Social History   Socioeconomic History   Marital status: Single    Spouse name: Not on file   Number of children: Not on file   Years of education: Not on file   Highest education level: Not on file  Occupational History   Not on file  Tobacco Use   Smoking status: Every Day    Packs/day: 0.10    Types: Cigarettes   Smokeless tobacco: Never  Vaping Use   Vaping Use: Never used  Substance and Sexual Activity   Alcohol use: No   Drug use: No   Sexual activity: Never  Other Topics Concern   Not on file  Social History Narrative   Not on file   Social Determinants of Health   Financial Resource Strain: Not on file  Food Insecurity: Not  on file  Transportation Needs: Not on file  Physical Activity: Not on file  Stress: Not on file  Social Connections: Not on file    Allergies: No Known Allergies  Metabolic Disorder Labs: No results found for: "HGBA1C", "MPG" No results found for: "PROLACTIN" No results found for: "CHOL", "TRIG", "HDL", "CHOLHDL", "VLDL", "LDLCALC" No results found for: "TSH"  Therapeutic Level Labs: No results found for: "LITHIUM" No results found for: "VALPROATE" No results found for: "CBMZ"  Current Medications: Current Outpatient Medications  Medication Sig Dispense Refill   hydrOXYzine (VISTARIL) 25 MG capsule Take 1 capsule (25 mg total) by mouth 3  (three) times daily as needed for anxiety. 30 capsule 0   sertraline (ZOLOFT) 50 MG tablet Take 1 tablet (50 mg total) by mouth daily. 30 tablet 2   No current facility-administered medications for this visit.     Musculoskeletal: Strength & Muscle Tone: within normal limits Gait & Station: normal Patient leans: N/A  Psychiatric Specialty Exam: Review of Systems  Blood pressure 113/72, pulse 71, SpO2 100 %.There is no height or weight on file to calculate BMI.  General Appearance: Casual  Eye Contact:  Good  Speech:  Clear and Coherent and Normal Rate  Volume:  Normal  Mood:  Euthymic  Affect:  Full Range  Thought Process:  Coherent and Linear  Orientation:  Full (Time, Place, and Person)  Thought Content: Logical   Suicidal Thoughts:  No  Homicidal Thoughts:  No  Memory:  intact  Judgement:  Good  Insight:  Good  Psychomotor Activity:  Normal  Concentration:  Concentration: Good and Attention Span: Good  Recall:  n/a  Fund of Knowledge: Good  Language: Good  Akathisia:  No  Handed:    AIMS (if indicated): not done  Assets:  Communication Skills Desire for Improvement Financial Resources/Insurance Housing Physical Health Social Support Talents/Skills  ADL's:  Intact  Cognition: WNL  Sleep:  Good   Screenings: GAD-7    Health and safety inspector from 11/15/2022 in [facility]  Total GAD-7 Score 14      PHQ2-9    Flowsheet Row Counselor from 11/15/2022 in [place]  PHQ-2 Total Score 4  PHQ-9 Total Score 16      Flowsheet Row Counselor from 11/15/2022 in [facility] ED from 10/20/2022 in [place] No Risk No Risk        Assessment and Plan:  Patient is a 27 year old female with reported past psychiatric history of depression presented to [facility] outpatient clinic for medication management follow-up.  Patient is doing  well on current medications.  She has started therapy at [facility].  Will not make any changes at this time.  She has not completed blood work yet but will complete it next week.  Adjustment disorder with mixed anxiety and depressed mood H/o depression -Continue Zoloft 50 mg daily for depression and anxiety.  -Continue hydroxyzine 25 mg 3 times daily as needed for anxiety.  Warned patient about the sedative effects and not to use and drive if it causes sedation. -Continue psychotherapy  -Ordered CBC, CMP, HbA1c, TSH, lipid panel for [facility] at last visit but has not completed yet.  She will complete it soon   Follow-up in 6 weeks   Collaboration of Care: Other None  Patient/Guardian was advised Release of Information must be obtained prior to any record release in order to collaborate their care  with an outside provider. Patient/Guardian was advised if they have not already done so to contact the registration department to sign all necessary forms in order for [place] to release information regarding their care.   Consent: Patient/Guardian gives verbal consent for treatment and assignment of benefits for services provided during this visit. Patient/Guardian expressed understanding and agreed to proceed.    Armando Reichert, MD 12/02/2022, 7:57 PM

## 2022-12-19 ENCOUNTER — Ambulatory Visit (INDEPENDENT_AMBULATORY_CARE_PROVIDER_SITE_OTHER): Payer: Medicaid Other | Admitting: Clinical

## 2022-12-19 DIAGNOSIS — F331 Major depressive disorder, recurrent, moderate: Secondary | ICD-10-CM | POA: Diagnosis not present

## 2022-12-19 NOTE — Progress Notes (Signed)
   THERAPIST PROGRESS NOTE  Session Time: 20 minutes  Participation Level: Active  Behavioral Response: CasualAlertEuthymic  Type of Therapy: Individual Therapy  Treatment Goals addressed: client will score less than 10 on the PHQ9 questionnaire   ProgressTowards Goals: Progressing  Interventions: CBT and Supportive  Summary:  Stacy Munoz is a 27 y.o. female who presents for the scheduled appointment oriented times five, appropriately dressed, and friendly. Client denied hallucinations and delusions. Client reported on today she is doing a lot better from the last time she was seen. Client reported she has been working full time. Client reported she decided to quit school at [employer]. Client reported she feels like a load has been lifted off her shoulders. Client reported she already has one degree and has learned what she needs to know from the degree she was working on. 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 Client reported she felt good to get back to doing what she enjoyed. Client reported it had been awhile since having motivation for doing that. Client reported she has also been spending more time going out with friends and being social. Client reported she has some trips planned soon. Client reported she decided to enjoy herself and learn to take everything as it is. Client reported she currently rates her depression as a 4 with 10 being the worst but expects her symptoms to improve as the week goes on. Evidence of progress towards goal:  client PHQ9 score is 3. Flowsheet Row Counselor from 12/19/2022 in [facility]  PHQ-9 Total Score 3        Suicidal/Homicidal: Nowithout intent/plan  Therapist Response:  Therapist began the appointment asking the client how she has been doing since last seen. Therapist used CBT to engage using active listening and positive emotional support. Therapist used  CBT to engage and ask th client to describe the progression of her depressive symptoms. Therapist used CBT to have the client identify her thought pattern that has changed to help improve her depression. Therapist used CBT to reinforce normalizing mood variations compared to stressors and encourage problem solving as well as self care. Therapist used CBT ask the client to identify her progress with frequency of use with coping skills with continued practice in her daily activity.    Therapist assigned the client homework to continue with self care, boundaries and challenging negative self talk.     Plan: Return again in 3 weeks.  Diagnosis: major depressive disorder, recurrent episode, moderate with anxious distress  Collaboration of Care: Patient refused AEB none requested by the client.  Patient/Guardian was advised Release of Information must be obtained prior to any record release in order to collaborate their care with an outside provider. Patient/Guardian was advised if they have not already done so to contact the registration department to sign all necessary forms in order for [place] to release information regarding their care.   Consent: Patient/Guardian gives verbal consent for treatment and assignment of benefits for services provided during this visit. Patient/Guardian expressed understanding and agreed to proceed.   [place], LCSW 12/19/2022

## 2023-01-02 ENCOUNTER — Encounter (HOSPITAL_COMMUNITY): Payer: Self-pay

## 2023-01-02 ENCOUNTER — Ambulatory Visit (HOSPITAL_COMMUNITY): Payer: Medicaid Other | Admitting: Clinical

## 2023-01-20 ENCOUNTER — Ambulatory Visit (INDEPENDENT_AMBULATORY_CARE_PROVIDER_SITE_OTHER): Payer: Medicaid Other | Admitting: Psychiatry

## 2023-01-20 VITALS — BP 126/64 | HR 76

## 2023-01-20 DIAGNOSIS — F1290 Cannabis use, unspecified, uncomplicated: Secondary | ICD-10-CM

## 2023-01-20 DIAGNOSIS — F4323 Adjustment disorder with mixed anxiety and depressed mood: Secondary | ICD-10-CM | POA: Diagnosis not present

## 2023-01-20 MED ORDER — SERTRALINE HCL 50 MG PO TABS: 50.0000 mg | ORAL_TABLET | Freq: Every day | ORAL | 2 refills | Status: AC

## 2023-01-20 NOTE — Progress Notes (Cosign Needed Addendum)
BH MD/PA/NP OP Progress Note  01/20/2023 9:42 AM Stacy Munoz  MRN:  [id number]  Chief Complaint:  Chief Complaint  Patient presents with   Follow-up   HPI:  Patient is a 27 y.o.  female with reported past psychiatric history of depression, adjustment disorder presented to [facility] outpatient clinic for medication management follow-up.  Pt reports that her mood has been "up and down" recently. She reports that she has been feeling irritable and annoyed because of her new roommate who is her old friend.  She states that he keeps bugging her all day as if he needs emotional support from her. Sh states that he is very clingy and nosy and always wants to include himself when she has company.  He constantly asks her to pick him up from work at odd times and does not give her privacy.  She states that he is not able to pay his rent for first month but she is hopeful that he would pay his rent from next month.  She reports that her cousins and friends also dislike him and do not want to include  him in any activities.  She reports that she does not feel irritable or angry with other people. Discussed creating healthy boundaries. She has been sleeping well.  She reports that she eats 1 full meal in a day. Encouraged her to eat healthy snacks in between.  Currently, she denies any suicidal ideations, homicidal ideations, auditory and visual hallucinations.  She denies paranoia.  She denies any medication side effects and has been tolerating it well.  She has started therapy at [facility] with [facility]. She is currently employed at [employer] . She drinks alcohol rarely and denies smoking marijuana.  She has started using CBD Gummies (1/every 2 days) instead for anxiety.  Educated her about negative side effects of THC and recommend complete cessation.  She is not utilizing hydroxyzine for anxiety.  Encouraged her to utilize hydroxyzine for anxiety instead of CBD Gummies.  She agrees with the plan.  Patient denies  any need for change in medication and medication dosages and wants to continue same meds.  She denies any other concerns. Patient is alert and oriented x 4, irritable, cooperative, and fully engaged in conversation during the encounter.  Her thought process is linear with coherent speech . She does not appear to be responding to internal/external stimuli   Visit Diagnosis:    ICD-10-CM   1. Adjustment disorder with mixed anxiety and depressed mood  F43.23 sertraline (ZOLOFT) 50 MG tablet      Past Psychiatric History: Previous Psych Diagnoses: Reported history of depression Prior inpatient treatment: Denies Psychotherapy hx: Got some therapy in 2021 Previous suicidal attempts: In 2021 was thinking of cutting her neck and wrist with a razor blade.  Self-injurious behaviors by cutting in 2021 Previous medication trials: Zoloft (max 100 mg daily).  Stopped taking as she was feeling better Current therapist: None  Past Medical History:  Past Medical History:  Diagnosis Date   Asthma    Eczema     Past Surgical History:  Procedure Laterality Date   spinal tap      Family Psychiatric History: Psych: Mom-bipolar SA/HA: Denies    Family History:  Family History  Problem Relation Age of Onset   Deafness Mother    Bipolar disorder Mother     Social History:  Social History   Socioeconomic History   Marital status: Single    Spouse name: Not on  file   Number of children: Not on file   Years of education: Not on file   Highest education level: Not on file  Occupational History   Not on file  Tobacco Use   Smoking status: Every Day    Packs/day: .1    Types: Cigarettes   Smokeless tobacco: Never  Vaping Use   Vaping Use: Never used  Substance and Sexual Activity   Alcohol use: No   Drug use: No   Sexual activity: Never  Other Topics Concern   Not on file  Social History Narrative   Not on file   Social Determinants of Health   Financial Resource Strain: Not on  file  Food Insecurity: Not on file  Transportation Needs: Not on file  Physical Activity: Not on file  Stress: Not on file  Social Connections: Not on file    Allergies: No Known Allergies  Metabolic Disorder Labs: No results found for: "HGBA1C", "MPG" No results found for: "PROLACTIN" No results found for: "CHOL", "TRIG", "HDL", "CHOLHDL", "VLDL", "LDLCALC" No results found for: "TSH"  Therapeutic Level Labs: No results found for: "LITHIUM" No results found for: "VALPROATE" No results found for: "CBMZ"  Current Medications: Current Outpatient Medications  Medication Sig Dispense Refill   hydrOXYzine (VISTARIL) 25 MG capsule Take 1 capsule (25 mg total) by mouth 3 (three) times daily as needed for anxiety. 30 capsule 0   sertraline (ZOLOFT) 50 MG tablet Take 1 tablet (50 mg total) by mouth daily. 30 tablet 2   No current facility-administered medications for this visit.     Musculoskeletal: Strength & Muscle Tone: within normal limits Gait & Station: normal Patient leans: N/A  Psychiatric Specialty Exam: Review of Systems  Blood pressure 126/64, pulse 76, SpO2 100 %.There is no height or weight on file to calculate BMI.  General Appearance: Casual  Eye Contact:  Good  Speech:  Clear and Coherent and Normal Rate  Volume:  Normal  Mood:  Irritable  Affect:  Full Range  Thought Process:  Coherent and Linear  Orientation:  Full (Time, Place, and Person)  Thought Content: Logical   Suicidal Thoughts:  No  Homicidal Thoughts:  No  Memory:  intact  Judgement:  Good  Insight:  Good  Psychomotor Activity:  Normal  Concentration:  Concentration: Good and Attention Span: Good  Recall:  n/a  Fund of Knowledge: Good  Language: Good  Akathisia:  No  Handed:    AIMS (if indicated): not done  Assets:  Communication Skills Desire for Improvement Financial Resources/Insurance Housing Physical Health Social Support Talents/Skills  ADL's:  Intact  Cognition: WNL   Sleep:  Good   Screenings: GAD-7    Advertising copywriterlowsheet Row Counselor [facility]  Total GAD-7 Score 14      PHQ2-9    Flowsheet Row [facility]  PHQ-2 Total Score 2 4  PHQ-9 Total Score 3 16      Flowsheet Row [facility]  C-SSRS RISK CATEGORY No Risk No Risk        Assessment and Plan:  Patient is a 27 y.o.  female with reported past psychiatric history of depression and adjustment disorder [facility] medication management follow-up.  Patient is feeling it report due to her new  roommate who is cling, needy and nosy  per patient.  She does not feel irritable, angry with her friends and cousins.  [facility]  She has start up to smoking marijuana but has started using CBD Gummies instead for anxiety.  Educated her about the negative effects of cannabis and recommend come to cessation.  Encouraged her to utilize hydroxyzine instead of CBD Gummies for anxiety.  Will not make any changes at this time.  She has not completed blood work yet but will complete it next week.  Adjustment disorder with mixed anxiety and depressed mood H/o depression -Continue Zoloft 50 mg daily for depression and anxiety.  -Utilize hydroxyzine 25 mg 3 times daily as needed for anxiety.  Warned patient about the sedative effects and not to use and drive if it causes sedation. -Continue psychotherapy  -Ordered CBC, CMP, HbA1c, TSH, lipid panel at last visit but has not completed yet.  She will complete it soon  -Added vitamin B-12 and D  Cannabis use (CBD Gummies) -Educated about the negative effects of cannabis.  -Recommend complete cessation and utilize hydroxyzine instead of  CBD Gummies to help with anxiety  Follow-up in 8 weeks   Collaboration of Care: Other Dr. Adrian BlackwaterStinson  Patient/Guardian was advised Release of Information must be obtained prior to any record release in order to collaborate their care with an outside provider. Patient/Guardian was advised if they have not already done so to contact the registration department to sign all necessary forms in order for us to release information regarding their care.   Consent: Patient/Guardian gives verbal consent for treatment and assignment of benefits for services provided during this visit. Patient/Guardian expressed understanding and agreed to proceed.    Karsten RoVandana  Cecilee Rosner, MD 01/20/2023, 9:42 AM

## 2023-01-25 ENCOUNTER — Other Ambulatory Visit (HOSPITAL_COMMUNITY): Payer: Medicaid Other

## 2023-01-25 DIAGNOSIS — F4323 Adjustment disorder with mixed anxiety and depressed mood: Secondary | ICD-10-CM | POA: Diagnosis not present

## 2023-01-26 LAB — VITAMIN B12: Vitamin B-12: 826 pg/mL (ref 232–1245)

## 2023-01-26 LAB — VITAMIN D 25 HYDROXY (VIT D DEFICIENCY, FRACTURES): Vit D, 25-Hydroxy: 12.6 ng/mL — ABNORMAL LOW (ref 30.0–100.0)

## 2023-01-26 LAB — SPECIMEN STATUS REPORT

## 2023-01-27 LAB — CMP14+EGFR
ALT: 19 IU/L (ref 0–32)
AST: 18 IU/L (ref 0–40)
Albumin/Globulin Ratio: 1.6 (ref 1.2–2.2)
Albumin: 4.1 g/dL (ref 4.0–5.0)
Alkaline Phosphatase: 66 IU/L (ref 44–121)
BUN/Creatinine Ratio: 12 (ref 9–23)
BUN: 12 mg/dL (ref 6–20)
Bilirubin Total: 0.6 mg/dL (ref 0.0–1.2)
CO2: 20 mmol/L (ref 20–29)
Calcium: 8.9 mg/dL (ref 8.7–10.2)
Chloride: 103 mmol/L (ref 96–106)
Creatinine, Ser: 1.01 mg/dL — ABNORMAL HIGH (ref 0.57–1.00)
Globulin, Total: 2.6 g/dL (ref 1.5–4.5)
Glucose: 98 mg/dL (ref 70–99)
Potassium: 4.2 mmol/L (ref 3.5–5.2)
Sodium: 138 mmol/L (ref 134–144)
Total Protein: 6.7 g/dL (ref 6.0–8.5)
eGFR: 80 mL/min/{1.73_m2} (ref >59)

## 2023-01-27 LAB — CBC WITH DIFFERENTIAL/PLATELET
Basophils Absolute: 0.1 10*3/uL (ref 0.0–0.2)
Basos: 1 %
EOS (ABSOLUTE): 0.3 10*3/uL (ref 0.0–0.4)
Eos: 6 %
Hematocrit: 43.9 % (ref 34.0–46.6)
Hemoglobin: 14.1 g/dL (ref 11.1–15.9)
Immature Grans (Abs): 0 10*3/uL (ref 0.0–0.1)
Immature Granulocytes: 0 %
Lymphocytes Absolute: 1.9 10*3/uL (ref 0.7–3.1)
Lymphs: 37 %
MCH: 29.9 pg (ref 26.6–33.0)
MCHC: 32.1 g/dL (ref 31.5–35.7)
MCV: 93 fL (ref 79–97)
Monocytes Absolute: 0.4 10*3/uL (ref 0.1–0.9)
Monocytes: 7 %
Neutrophils Absolute: 2.5 10*3/uL (ref 1.4–7.0)
Neutrophils: 49 %
Platelets: 253 10*3/uL (ref 150–450)
RBC: 4.72 x10E6/uL (ref 3.77–5.28)
RDW: 12.3 % (ref 11.7–15.4)
WBC: 5.1 10*3/uL (ref 3.4–10.8)

## 2023-01-27 LAB — LIPID PANEL
Chol/HDL Ratio: 2.4 ratio (ref 0.0–4.4)
Cholesterol, Total: 186 mg/dL (ref 100–199)
HDL: 77 mg/dL (ref >39)
LDL Chol Calc (NIH): 101 mg/dL — ABNORMAL HIGH (ref 0–99)
Triglycerides: 42 mg/dL (ref 0–149)
VLDL Cholesterol Cal: 8 mg/dL (ref 5–40)

## 2023-01-27 LAB — SPECIMEN STATUS REPORT

## 2023-01-27 LAB — TSH: TSH: 0.628 u[IU]/mL (ref 0.450–4.500)

## 2023-01-27 LAB — HEMOGLOBIN A1C
Est. average glucose Bld gHb Est-mCnc: 108 mg/dL
Hgb A1c MFr Bld: 5.4 % (ref 4.8–5.6)

## 2023-02-16 DIAGNOSIS — S62306A Unspecified fracture of fifth metacarpal bone, right hand, initial encounter for closed fracture: Secondary | ICD-10-CM | POA: Diagnosis not present

## 2023-02-16 DIAGNOSIS — M79641 Pain in right hand: Secondary | ICD-10-CM | POA: Diagnosis not present

## 2023-02-28 ENCOUNTER — Ambulatory Visit: Payer: Medicaid Other | Admitting: Family Medicine

## 2023-03-03 DIAGNOSIS — S62346A Nondisplaced fracture of base of fifth metacarpal bone, right hand, initial encounter for closed fracture: Secondary | ICD-10-CM | POA: Diagnosis not present

## 2023-03-20 DIAGNOSIS — S62346A Nondisplaced fracture of base of fifth metacarpal bone, right hand, initial encounter for closed fracture: Secondary | ICD-10-CM | POA: Diagnosis not present

## 2023-03-20 DIAGNOSIS — S62346D Nondisplaced fracture of base of fifth metacarpal bone, right hand, subsequent encounter for fracture with routine healing: Secondary | ICD-10-CM | POA: Diagnosis not present

## 2023-03-24 ENCOUNTER — Encounter (HOSPITAL_COMMUNITY): Payer: Medicaid Other | Admitting: Psychiatry

## 2023-04-11 DIAGNOSIS — S62346D Nondisplaced fracture of base of fifth metacarpal bone, right hand, subsequent encounter for fracture with routine healing: Secondary | ICD-10-CM | POA: Diagnosis not present

## 2023-07-01 DIAGNOSIS — B9789 Other viral agents as the cause of diseases classified elsewhere: Secondary | ICD-10-CM | POA: Diagnosis not present

## 2023-07-01 DIAGNOSIS — J45901 Unspecified asthma with (acute) exacerbation: Secondary | ICD-10-CM | POA: Diagnosis not present

## 2023-07-01 DIAGNOSIS — R059 Cough, unspecified: Secondary | ICD-10-CM | POA: Diagnosis not present

## 2023-07-01 DIAGNOSIS — J069 Acute upper respiratory infection, unspecified: Secondary | ICD-10-CM | POA: Diagnosis not present

## 2023-07-01 DIAGNOSIS — Z20822 Contact with and (suspected) exposure to covid-19: Secondary | ICD-10-CM | POA: Diagnosis not present
